# Patient Record
Sex: Female | Born: 1965 | Race: Black or African American | Hispanic: No | Marital: Single | Smoking: Current some day smoker
Health system: Southern US, Community
[De-identification: ages and names within clinical notes are randomized; demographics above are authoritative.]

---

## 2018-01-18 DIAGNOSIS — F4323 Adjustment disorder with mixed anxiety and depressed mood: Secondary | ICD-10-CM | POA: Diagnosis not present

## 2018-02-11 DIAGNOSIS — S93402A Sprain of unspecified ligament of left ankle, initial encounter: Secondary | ICD-10-CM | POA: Diagnosis not present

## 2019-04-08 ENCOUNTER — Ambulatory Visit (INDEPENDENT_AMBULATORY_CARE_PROVIDER_SITE_OTHER): Payer: BC Managed Care – PPO | Admitting: Family Medicine

## 2019-04-08 ENCOUNTER — Encounter: Payer: Self-pay | Admitting: Family Medicine

## 2019-04-08 ENCOUNTER — Other Ambulatory Visit: Payer: Self-pay

## 2019-04-08 VITALS — BP 138/88 | HR 78 | Ht 64.0 in | Wt 172.0 lb

## 2019-04-08 DIAGNOSIS — N760 Acute vaginitis: Secondary | ICD-10-CM | POA: Diagnosis not present

## 2019-04-08 DIAGNOSIS — Z23 Encounter for immunization: Secondary | ICD-10-CM | POA: Diagnosis not present

## 2019-04-08 DIAGNOSIS — Z1231 Encounter for screening mammogram for malignant neoplasm of breast: Secondary | ICD-10-CM | POA: Diagnosis not present

## 2019-04-08 DIAGNOSIS — N898 Other specified noninflammatory disorders of vagina: Secondary | ICD-10-CM

## 2019-04-08 DIAGNOSIS — Z113 Encounter for screening for infections with a predominantly sexual mode of transmission: Secondary | ICD-10-CM | POA: Diagnosis not present

## 2019-04-08 DIAGNOSIS — B9689 Other specified bacterial agents as the cause of diseases classified elsewhere: Secondary | ICD-10-CM | POA: Diagnosis not present

## 2019-04-08 MED ORDER — METRONIDAZOLE 500 MG PO TABS: 500.0000 mg | ORAL_TABLET | Freq: Two times a day (BID) | ORAL | 0 refills | Status: DC

## 2019-04-08 NOTE — Progress Notes (Signed)
Last pap a year a half ago Needs referral for mammo

## 2019-04-08 NOTE — Progress Notes (Signed)
    Subjective:    Patient ID: Gabriella Foster is a 53 y.o. female presenting with Gynecologic Exam (possible STD exposer)  on 04/08/2019  HPI: Reports that she has a partner that tested positive for Gardnerella on urine sample. She is concerned about exposure to STD.  Denies vaginal discharge. Menopause 2-3 years ago. No further hot flashes  Review of Systems  Constitutional: Negative for chills and fever.  Respiratory: Negative for shortness of breath.   Cardiovascular: Negative for chest pain.  Gastrointestinal: Negative for abdominal pain, nausea and vomiting.  Genitourinary: Negative for dysuria.  Skin: Negative for rash.      Objective:    BP 138/88   Pulse 78   Ht 5\' 4"  (1.626 m)   Wt 172 lb (78 kg)   BMI 29.52 kg/m  Physical Exam Constitutional:      General: She is not in acute distress.    Appearance: She is well-developed.  HENT:     Head: Normocephalic and atraumatic.  Eyes:     General: No scleral icterus. Neck:     Musculoskeletal: Neck supple.  Cardiovascular:     Rate and Rhythm: Normal rate.  Pulmonary:     Effort: Pulmonary effort is normal.  Abdominal:     Palpations: Abdomen is soft.  Skin:    General: Skin is warm and dry.  Neurological:     Mental Status: She is alert and oriented to person, place, and time.         Assessment & Plan:   Problem List Items Addressed This Visit    None    Visit Diagnoses    Encounter for screening mammogram for malignant neoplasm of breast    -  Primary   Relevant Orders   Mammogram Screening Bilateral Tomo   Need for influenza vaccination       Relevant Orders   Flu Vaccine QUAD 36+ mos IM (Fluarix, Quad PF) (Completed)   Screen for STD (sexually transmitted disease)       Relevant Orders   Cervicovaginal ancillary only( [place])   Hepatitis B surface antigen   HIV antibody   RPR   Hepatitis C antibody   Bacterial vaginosis       treat presumptively-advised of pathogenesis, avoidance of EtOH  during tx.   Relevant Medications   metroNIDAZOLE (FLAGYL) 500 MG tablet      Total face-to-face time with patient: 20 minutes. Over 50% of encounter was spent on counseling and coordination of care. Return in about 1 year (around 04/07/2020), or if symptoms worsen or fail to improve.  Donnamae Jude 04/08/2019 2:37 PM

## 2019-04-08 NOTE — Patient Instructions (Signed)

## 2019-04-09 ENCOUNTER — Telehealth: Payer: Self-pay | Admitting: *Deleted

## 2019-04-09 LAB — HEPATITIS C ANTIBODY: Hep C Virus Ab: <0.1 s/co ratio (ref 0.0–0.9)

## 2019-04-09 LAB — RPR: RPR Ser Ql: NONREACTIVE

## 2019-04-09 LAB — HIV ANTIBODY (ROUTINE TESTING W REFLEX): HIV Screen 4th Generation wRfx: NONREACTIVE

## 2019-04-09 LAB — HEPATITIS B SURFACE ANTIGEN: Hepatitis B Surface Ag: NEGATIVE

## 2019-04-09 NOTE — Telephone Encounter (Signed)
-----   Message from Donnamae Jude, MD sent at 04/09/2019  9:05 AM EST ----- Blood tests are normal.

## 2019-04-09 NOTE — Telephone Encounter (Signed)
Pt informed of results.

## 2019-04-09 NOTE — Telephone Encounter (Signed)
Left message for pt to call back  °

## 2019-04-09 NOTE — Telephone Encounter (Signed)
-----   Message from Tanya S Pratt, MD sent at 04/09/2019  9:05 AM EST ----- Blood tests are normal. 

## 2019-04-10 LAB — CERVICOVAGINAL ANCILLARY ONLY
Bacterial Vaginitis (gardnerella): POSITIVE — AB
Candida Glabrata: NEGATIVE
Candida Vaginitis: NEGATIVE
Chlamydia: NEGATIVE
Comment: NEGATIVE
Comment: NEGATIVE
Comment: NEGATIVE
Comment: NEGATIVE
Comment: NEGATIVE
Comment: NORMAL
Neisseria Gonorrhea: NEGATIVE
Trichomonas: NEGATIVE

## 2019-04-11 ENCOUNTER — Telehealth: Payer: Self-pay | Admitting: *Deleted

## 2019-04-11 NOTE — Telephone Encounter (Signed)
Pt informed of results.

## 2019-05-14 ENCOUNTER — Encounter: Payer: Self-pay | Admitting: Obstetrics & Gynecology

## 2019-06-13 ENCOUNTER — Other Ambulatory Visit: Payer: Self-pay

## 2019-06-13 ENCOUNTER — Ambulatory Visit (INDEPENDENT_AMBULATORY_CARE_PROVIDER_SITE_OTHER): Payer: BC Managed Care – PPO | Admitting: Obstetrics & Gynecology

## 2019-06-13 ENCOUNTER — Encounter: Payer: Self-pay | Admitting: Obstetrics & Gynecology

## 2019-06-13 VITALS — BP 128/86 | HR 72

## 2019-06-13 DIAGNOSIS — Z01419 Encounter for gynecological examination (general) (routine) without abnormal findings: Secondary | ICD-10-CM | POA: Diagnosis not present

## 2019-06-13 DIAGNOSIS — Z124 Encounter for screening for malignant neoplasm of cervix: Secondary | ICD-10-CM

## 2019-06-13 DIAGNOSIS — Z1151 Encounter for screening for human papillomavirus (HPV): Secondary | ICD-10-CM | POA: Diagnosis not present

## 2019-06-13 DIAGNOSIS — N760 Acute vaginitis: Secondary | ICD-10-CM

## 2019-06-13 DIAGNOSIS — Z113 Encounter for screening for infections with a predominantly sexual mode of transmission: Secondary | ICD-10-CM

## 2019-06-13 DIAGNOSIS — B9689 Other specified bacterial agents as the cause of diseases classified elsewhere: Secondary | ICD-10-CM | POA: Diagnosis not present

## 2019-06-13 DIAGNOSIS — N898 Other specified noninflammatory disorders of vagina: Secondary | ICD-10-CM | POA: Diagnosis not present

## 2019-06-13 NOTE — Progress Notes (Signed)
GYNECOLOGY ANNUAL PREVENTATIVE CARE ENCOUNTER NOTE  History:     Llewellyn Schoenberger is a 54 y.o. G30P0020 female here for annual gynecologic exam.  She was last seen two months for vaginitis, was diagnosed and treated for BV. Current complaints: none.   Denies abnormal vaginal bleeding, discharge, pelvic pain, problems with intercourse or other gynecologic concerns.    Gynecologic History No LMP recorded. Patient is postmenopausal. Contraception: post menopausal status Last Pap: unknown. Results were: normal Last mammogram: 2019. Results were: normal  Obstetric History OB History  Gravida Para Term Preterm AB Living  2       2    SAB TAB Ectopic Multiple Live Births  1 1     0    # Outcome Date GA Lbr Len/2nd Weight Sex Delivery Anes PTL Lv  2 TAB           1 SAB             History reviewed. No pertinent past medical history.  History reviewed. No pertinent surgical history.  Current Outpatient Medications on File Prior to Visit  Medication Sig Dispense Refill  . metroNIDAZOLE (FLAGYL) 500 MG tablet Take 1 tablet (500 mg total) by mouth 2 (two) times daily. (Patient not taking: Reported on 06/13/2019) 14 tablet 0   No current facility-administered medications on file prior to visit.    Allergies  Allergen Reactions  . Neosporin [Bacitracin-Polymyxin B]   . Nickel   . Penicillins     Social History:  reports that she has been smoking. She has been smoking about 0.25 packs per day. She has never used smokeless tobacco. She reports previous alcohol use. She reports previous drug use.  Family History  Problem Relation Age of Onset  . Hypertension Mother   . Hypertension Brother   . Breast cancer Maternal Aunt     The following portions of the patient's history were reviewed and updated as appropriate: allergies, current medications, past family history, past medical history, past social history, past surgical history and problem list.  Review of Systems Pertinent items  noted in HPI and remainder of comprehensive ROS otherwise negative.  Physical Exam:  BP 128/86   Pulse 72  CONSTITUTIONAL: Well-developed, well-nourished female in no acute distress.  HENT:  Normocephalic, atraumatic, External right and left ear normal. Oropharynx is clear and moist EYES: Conjunctivae and EOM are normal. Pupils are equal, round, and reactive to light. No scleral icterus.  NECK: Normal range of motion, supple, no masses.  Normal thyroid.  SKIN: Skin is warm and dry. No rash noted. Not diaphoretic. No erythema. No pallor. MUSCULOSKELETAL: Normal range of motion. No tenderness.  No cyanosis, clubbing, or edema.  2+ distal pulses. NEUROLOGIC: Alert and oriented to person, place, and time. Normal reflexes, muscle tone coordination.  PSYCHIATRIC: Normal mood and affect. Normal behavior. Normal judgment and thought content. CARDIOVASCULAR: Normal heart rate noted, regular rhythm RESPIRATORY: Clear to auscultation bilaterally. Effort and breath sounds normal, no problems with respiration noted. BREASTS: Symmetric in size. No masses, tenderness, skin changes, nipple drainage, or lymphadenopathy bilaterally. ABDOMEN: Soft, no distention noted.  No tenderness, rebound or guarding.  PELVIC: Normal appearing external genitalia and urethral meatus; normal appearing vaginal mucosa and cervix.  No abnormal discharge noted.  Pap smear obtained.  Normal uterine size, no other palpable masses, no uterine or adnexal tenderness.   Assessment and Plan:    1. Well woman exam with routine gynecological exam - Cytology+HPV test - PAP ([facility]) - Cervicovaginal ancillary only Will follow up results of pap smear and manage accordingly. Mammogram already scheduled Routine preventative health maintenance measures emphasized. Please refer to After Visit Summary for other counseling recommendations.      Jaynie Collins, MD, FACOG Obstetrician & Gynecologist, [facility] for [employer], [facility]

## 2019-06-13 NOTE — Patient Instructions (Signed)
l.lPreventive Care 43-52 Years Old, Female Preventive care refers to visits with your health care provider and lifestyle choices that can promote health and wellness. This includes:  A yearly physical exam. This may also be called an annual well check.  Regular dental visits and eye exams.  Immunizations.  Screening for certain conditions.  Healthy lifestyle choices, such as eating a healthy diet, getting regular exercise, not using drugs or products that contain nicotine and tobacco, and limiting alcohol use. What can I expect for my preventive care visit? Physical exam Your health care provider will check your:  Height and weight. This may be used to calculate body mass index (BMI), which tells if you are at a healthy weight.  Heart rate and blood pressure.  Skin for abnormal spots. Counseling Your health care provider may ask you questions about your:  Alcohol, tobacco, and drug use.  Emotional well-being.  Home and relationship well-being.  Sexual activity.  Eating habits.  Work and work Statistician.  Method of birth control.  Menstrual cycle.  Pregnancy history. What immunizations do I need?  Influenza (flu) vaccine  This is recommended every year. Tetanus, diphtheria, and pertussis (Tdap) vaccine  You may need a Td booster every 10 years. Varicella (chickenpox) vaccine  You may need this if you have not been vaccinated. Zoster (shingles) vaccine  You may need this after age 51. Measles, mumps, and rubella (MMR) vaccine  You may need at least one dose of MMR if you were born in 1957 or later. You may also need a second dose. Pneumococcal conjugate (PCV13) vaccine  You may need this if you have certain conditions and were not previously vaccinated. Pneumococcal polysaccharide (PPSV23) vaccine  You may need one or two doses if you smoke cigarettes or if you have certain conditions. Meningococcal conjugate (MenACWY) vaccine  You may need this if you  have certain conditions. Hepatitis A vaccine  You may need this if you have certain conditions or if you travel or work in places where you may be exposed to hepatitis A. Hepatitis B vaccine  You may need this if you have certain conditions or if you travel or work in places where you may be exposed to hepatitis B. Haemophilus influenzae type b (Hib) vaccine  You may need this if you have certain conditions. Human papillomavirus (HPV) vaccine  If recommended by your health care provider, you may need three doses over 6 months. You may receive vaccines as individual doses or as more than one vaccine together in one shot (combination vaccines). Talk with your health care provider about the risks and benefits of combination vaccines. What tests do I need? Blood tests  Lipid and cholesterol levels. These may be checked every 5 years, or more frequently if you are over 56 years old.  Hepatitis C test.  Hepatitis B test. Screening  Lung cancer screening. You may have this screening every year starting at age 66 if you have a 30-pack-year history of smoking and currently smoke or have quit within the past 15 years.  Colorectal cancer screening. All adults should have this screening starting at age 56 and continuing until age 53. Your health care provider may recommend screening at age 39 if you are at increased risk. You will have tests every 1-10 years, depending on your results and the type of screening test.  Diabetes screening. This is done by checking your blood sugar (glucose) after you have not eaten for a while (fasting). You may have this  done every 1-3 years.  Mammogram. This may be done every 1-2 years. Talk with your health care provider about when you should start having regular mammograms. This may depend on whether you have a family history of breast cancer.  BRCA-related cancer screening. This may be done if you have a family history of breast, ovarian, tubal, or peritoneal  cancers.  Pelvic exam and Pap test. This may be done every 3 years starting at age 66. Starting at age 81, this may be done every 5 years if you have a Pap test in combination with an HPV test. Other tests  Sexually transmitted disease (STD) testing.  Bone density scan. This is done to screen for osteoporosis. You may have this scan if you are at high risk for osteoporosis. Follow these instructions at home: Eating and drinking  Eat a diet that includes fresh fruits and vegetables, whole grains, lean protein, and low-fat dairy.  Take vitamin and mineral supplements as recommended by your health care provider.  Do not drink alcohol if: ? Your health care provider tells you not to drink. ? You are pregnant, may be pregnant, or are planning to become pregnant.  If you drink alcohol: ? Limit how much you have to 0-1 drink a day. ? Be aware of how much alcohol is in your drink. In the [place] one drink equals one 12 oz bottle of beer (355 mL), one 5 oz glass of wine (148 mL), or one 1 oz glass of hard liquor (44 mL). Lifestyle  Take daily care of your teeth and gums.  Stay active. Exercise for at least 30 minutes on 5 or more days each week.  Do not use any products that contain nicotine or tobacco, such as cigarettes, e-cigarettes, and chewing tobacco. If you need help quitting, ask your health care provider.  If you are sexually active, practice safe sex. Use a condom or other form of birth control (contraception) in order to prevent pregnancy and STIs (sexually transmitted infections).  If told by your health care provider, take low-dose aspirin daily starting at age 62. What's next?  Visit your health care provider once a year for a well check visit.  Ask your health care provider how often you should have your eyes and teeth checked.  Stay up to date on all vaccines. This information is not intended to replace advice given to you by your health care provider. Make sure you  discuss any questions you have with your health care provider. Document Revised: 01/25/2018 Document Reviewed: 01/25/2018 Elsevier Patient Education  2020 [employer].

## 2019-06-17 LAB — CERVICOVAGINAL ANCILLARY ONLY
Bacterial Vaginitis (gardnerella): POSITIVE — AB
Candida Glabrata: NEGATIVE
Candida Vaginitis: NEGATIVE
Chlamydia: NEGATIVE
Comment: NEGATIVE
Comment: NEGATIVE
Comment: NEGATIVE
Comment: NEGATIVE
Comment: NEGATIVE
Comment: NORMAL
Neisseria Gonorrhea: NEGATIVE
Trichomonas: NEGATIVE

## 2019-06-17 LAB — CYTOLOGY - PAP
Comment: NEGATIVE
Diagnosis: NEGATIVE
High risk HPV: NEGATIVE

## 2019-06-18 MED ORDER — METRONIDAZOLE 500 MG PO TABS: 500.0000 mg | ORAL_TABLET | Freq: Two times a day (BID) | ORAL | 0 refills | Status: AC

## 2019-06-18 NOTE — Addendum Note (Signed)
Addended by: Jaynie Collins A on: 06/18/2019 09:36 AM   Modules accepted: Orders

## 2019-07-10 DIAGNOSIS — F4321 Adjustment disorder with depressed mood: Secondary | ICD-10-CM | POA: Diagnosis not present

## 2019-07-10 DIAGNOSIS — Z630 Problems in relationship with spouse or partner: Secondary | ICD-10-CM | POA: Diagnosis not present

## 2019-07-12 DIAGNOSIS — F4321 Adjustment disorder with depressed mood: Secondary | ICD-10-CM | POA: Diagnosis not present

## 2019-07-12 DIAGNOSIS — Z630 Problems in relationship with spouse or partner: Secondary | ICD-10-CM | POA: Diagnosis not present

## 2019-07-14 DIAGNOSIS — F4321 Adjustment disorder with depressed mood: Secondary | ICD-10-CM | POA: Diagnosis not present

## 2019-07-14 DIAGNOSIS — Z630 Problems in relationship with spouse or partner: Secondary | ICD-10-CM | POA: Diagnosis not present

## 2019-07-17 DIAGNOSIS — F4321 Adjustment disorder with depressed mood: Secondary | ICD-10-CM | POA: Diagnosis not present

## 2019-07-17 DIAGNOSIS — Z630 Problems in relationship with spouse or partner: Secondary | ICD-10-CM | POA: Diagnosis not present

## 2019-07-21 DIAGNOSIS — F4321 Adjustment disorder with depressed mood: Secondary | ICD-10-CM | POA: Diagnosis not present

## 2019-07-21 DIAGNOSIS — Z630 Problems in relationship with spouse or partner: Secondary | ICD-10-CM | POA: Diagnosis not present

## 2019-07-25 DIAGNOSIS — Z630 Problems in relationship with spouse or partner: Secondary | ICD-10-CM | POA: Diagnosis not present

## 2019-07-25 DIAGNOSIS — F4321 Adjustment disorder with depressed mood: Secondary | ICD-10-CM | POA: Diagnosis not present

## 2019-07-28 DIAGNOSIS — Z630 Problems in relationship with spouse or partner: Secondary | ICD-10-CM | POA: Diagnosis not present

## 2019-07-28 DIAGNOSIS — F4321 Adjustment disorder with depressed mood: Secondary | ICD-10-CM | POA: Diagnosis not present

## 2019-08-01 DIAGNOSIS — Z630 Problems in relationship with spouse or partner: Secondary | ICD-10-CM | POA: Diagnosis not present

## 2019-08-01 DIAGNOSIS — F4321 Adjustment disorder with depressed mood: Secondary | ICD-10-CM | POA: Diagnosis not present

## 2019-08-08 DIAGNOSIS — Z630 Problems in relationship with spouse or partner: Secondary | ICD-10-CM | POA: Diagnosis not present

## 2019-08-08 DIAGNOSIS — F4321 Adjustment disorder with depressed mood: Secondary | ICD-10-CM | POA: Diagnosis not present

## 2019-08-15 DIAGNOSIS — F4321 Adjustment disorder with depressed mood: Secondary | ICD-10-CM | POA: Diagnosis not present

## 2019-08-15 DIAGNOSIS — Z630 Problems in relationship with spouse or partner: Secondary | ICD-10-CM | POA: Diagnosis not present

## 2019-08-22 ENCOUNTER — Ambulatory Visit
Admission: RE | Admit: 2019-08-22 | Discharge: 2019-08-22 | Disposition: A | Discharge status: Home / Self Care | Payer: BC Managed Care – PPO | Source: Ambulatory Visit | Attending: Family Medicine | Admitting: Family Medicine

## 2019-08-22 DIAGNOSIS — Z1231 Encounter for screening mammogram for malignant neoplasm of breast: Secondary | ICD-10-CM | POA: Diagnosis not present

## 2019-08-29 DIAGNOSIS — F4321 Adjustment disorder with depressed mood: Secondary | ICD-10-CM | POA: Diagnosis not present

## 2019-08-29 DIAGNOSIS — Z630 Problems in relationship with spouse or partner: Secondary | ICD-10-CM | POA: Diagnosis not present

## 2019-09-02 ENCOUNTER — Other Ambulatory Visit: Payer: Self-pay | Admitting: Family Medicine

## 2019-09-02 DIAGNOSIS — N6320 Unspecified lump in the left breast, unspecified quadrant: Secondary | ICD-10-CM

## 2019-09-02 DIAGNOSIS — R928 Other abnormal and inconclusive findings on diagnostic imaging of breast: Secondary | ICD-10-CM

## 2019-09-02 DIAGNOSIS — N6310 Unspecified lump in the right breast, unspecified quadrant: Secondary | ICD-10-CM

## 2019-09-11 ENCOUNTER — Ambulatory Visit
Admission: RE | Admit: 2019-09-11 | Discharge: 2019-09-11 | Disposition: A | Discharge status: Home / Self Care | Payer: BC Managed Care – PPO | Source: Ambulatory Visit | Attending: Family Medicine | Admitting: Family Medicine

## 2019-09-11 DIAGNOSIS — N6310 Unspecified lump in the right breast, unspecified quadrant: Secondary | ICD-10-CM

## 2019-09-11 DIAGNOSIS — N6012 Diffuse cystic mastopathy of left breast: Secondary | ICD-10-CM | POA: Diagnosis not present

## 2019-09-11 DIAGNOSIS — N6312 Unspecified lump in the right breast, upper inner quadrant: Secondary | ICD-10-CM | POA: Diagnosis not present

## 2019-09-11 DIAGNOSIS — R928 Other abnormal and inconclusive findings on diagnostic imaging of breast: Secondary | ICD-10-CM | POA: Diagnosis not present

## 2019-09-11 DIAGNOSIS — N6320 Unspecified lump in the left breast, unspecified quadrant: Secondary | ICD-10-CM | POA: Insufficient documentation

## 2019-09-19 DIAGNOSIS — F4321 Adjustment disorder with depressed mood: Secondary | ICD-10-CM | POA: Diagnosis not present

## 2019-09-19 DIAGNOSIS — Z630 Problems in relationship with spouse or partner: Secondary | ICD-10-CM | POA: Diagnosis not present

## 2020-04-08 ENCOUNTER — Encounter: Payer: Self-pay | Admitting: Radiology

## 2021-11-06 IMAGING — MG DIGITAL SCREENING BILAT W/ TOMO W/ CAD
8 series · 8 of 24 positions shown · non-contrast
Comparison: None.

CLINICAL DATA: Screening.

EXAM:
DIGITAL SCREENING BILATERAL MAMMOGRAM WITH TOMO AND CAD

[L CC synth-2D]
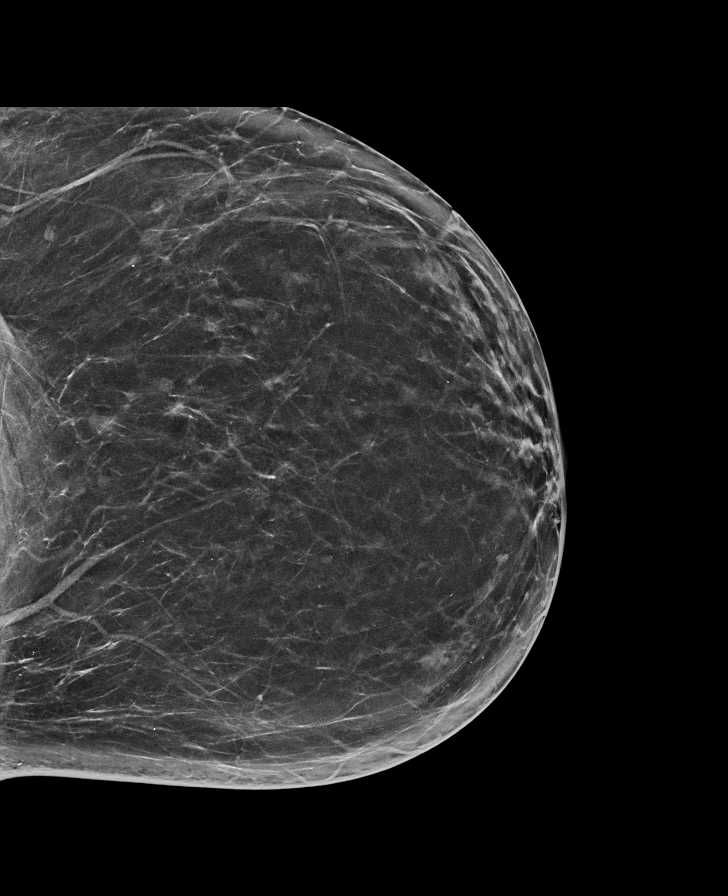

[R MLO synth-2D]
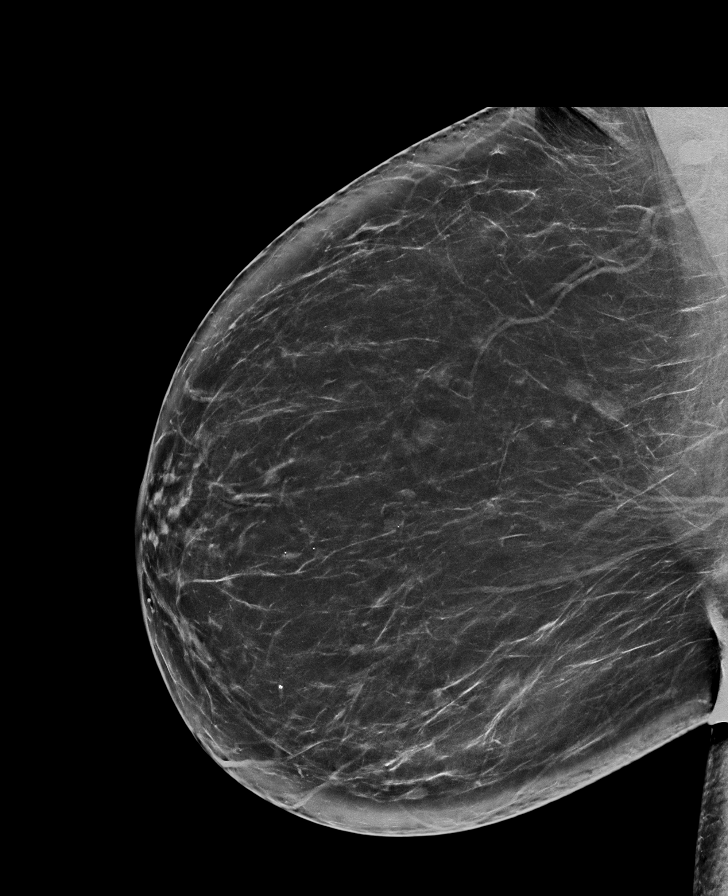

[R CC synth-2D]
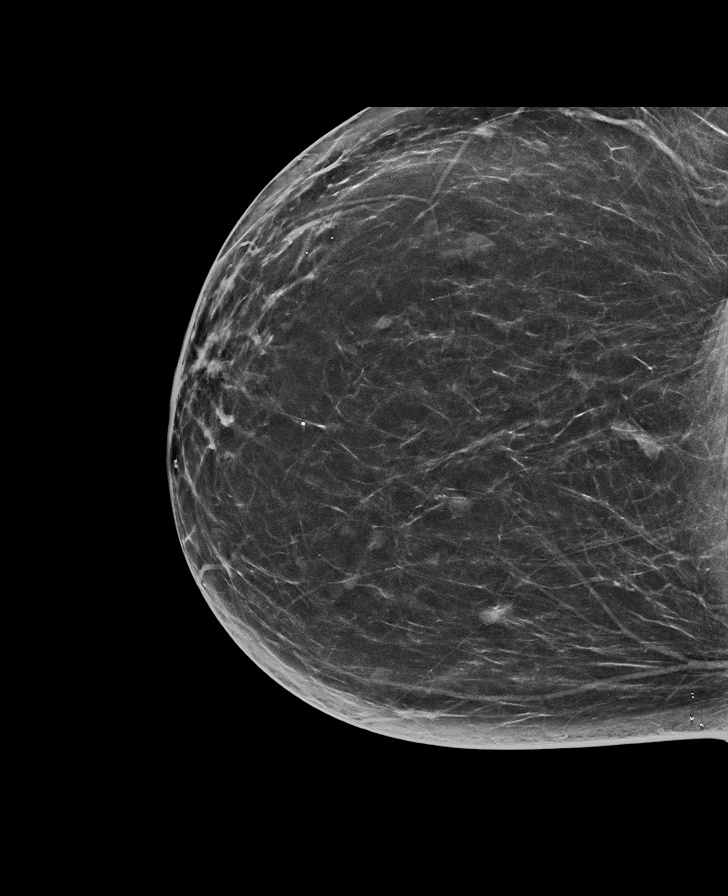

[L MLO synth-2D]
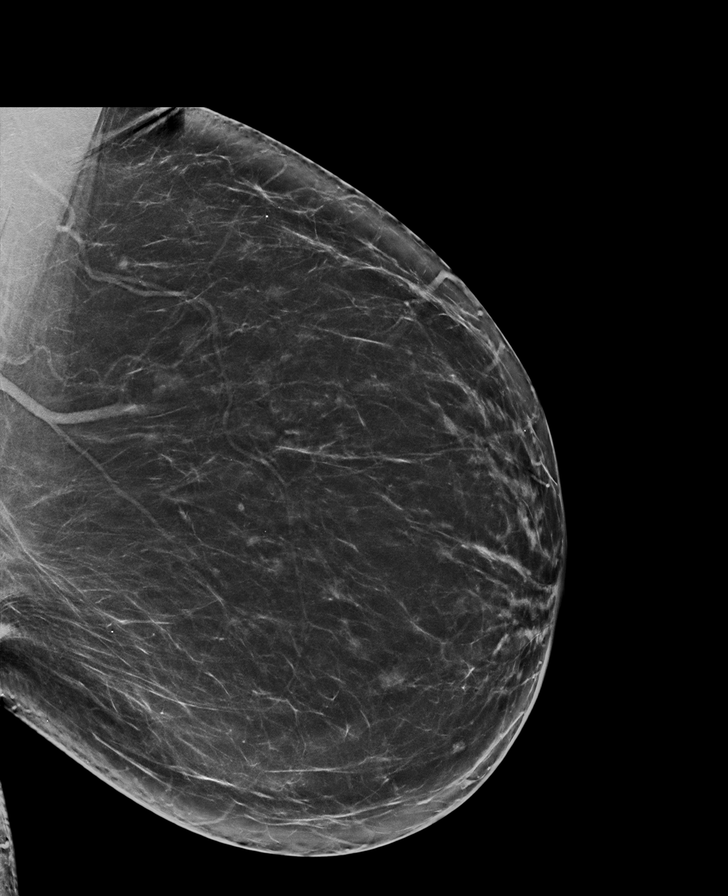

[L CC tomo · tomo slice 37/73.0]
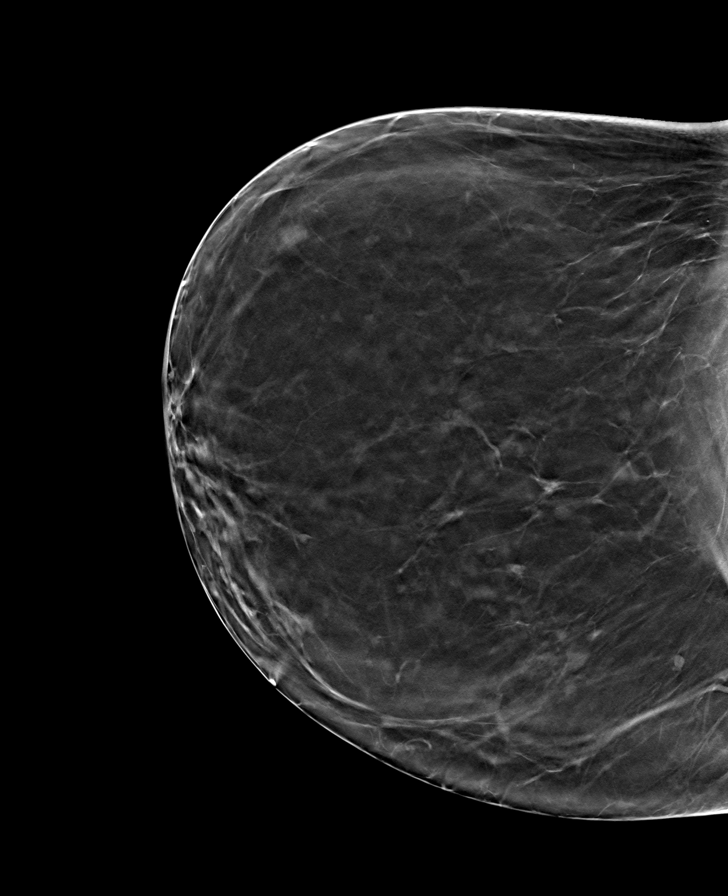

[R CC tomo · tomo slice 38/75.0]
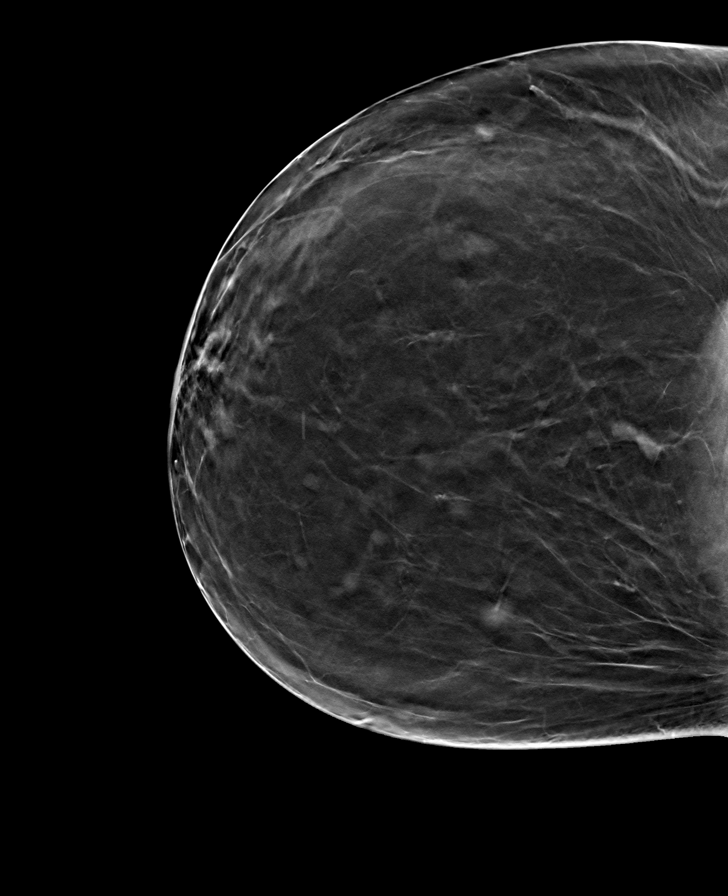

[R MLO tomo · tomo slice 41/82.0]
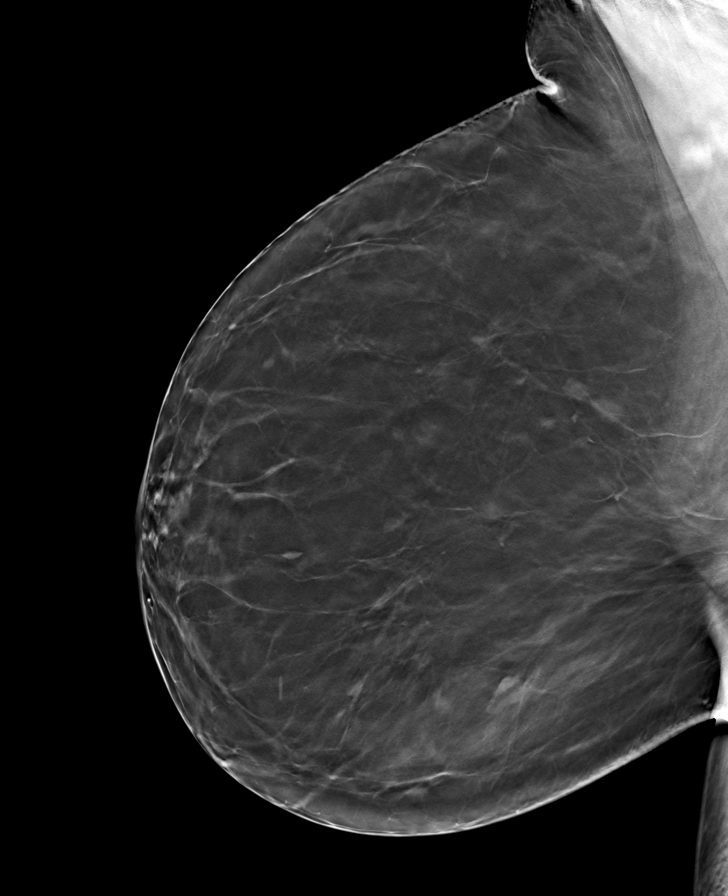

[L MLO tomo · tomo slice 41/80.0]
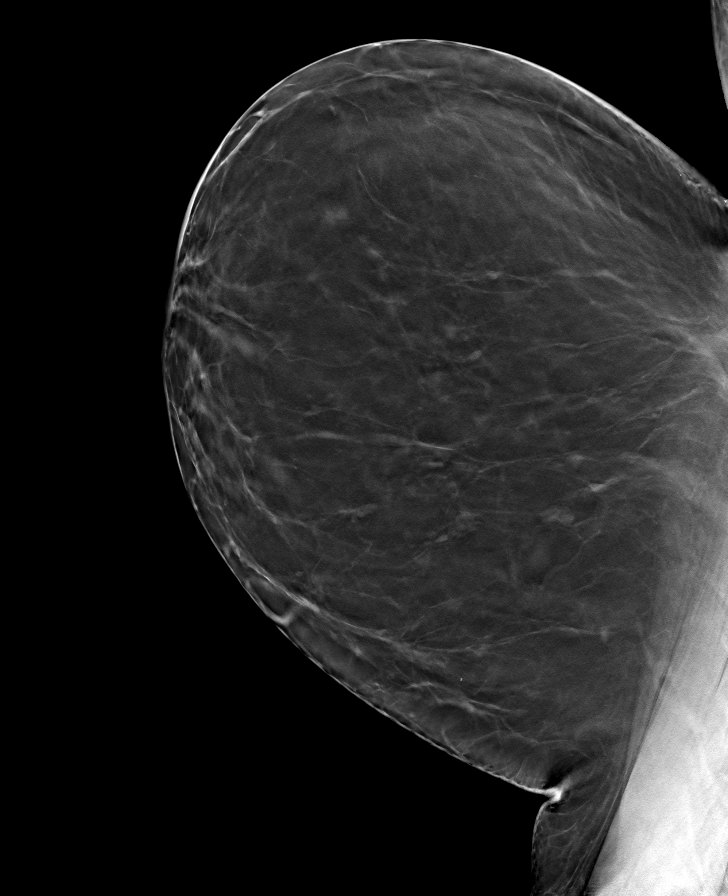

[8 of 24 positions shown; findings below may reference images not displayed]

ACR Breast Density Category b: There are scattered areas of
fibroglandular density.
FINDINGS: In the right breast possible masses requires further evaluation.

In the left breast possible mass requires further evaluation.

Images were processed with CAD.
IMPRESSION: Further evaluation is suggested for possible masses in the right
breast.

Further evaluation is suggested for possible mass in the left
breast.

RECOMMENDATION:
Diagnostic mammogram and possibly ultrasound of both breasts.
(Code:[id number]-G-SSW)

The patient will be contacted regarding the findings, and additional
imaging will be scheduled.

BI-RADS CATEGORY  0: Incomplete. Need additional imaging evaluation
and/or prior mammograms for comparison.

## 2023-07-24 LAB — COLOGUARD
